# Patient Record
Sex: Male | Born: 2000 | Race: White | Hispanic: No | Marital: Single | Smoking: Never smoker
Health system: Southern US, Community
[De-identification: ages and names within clinical notes are randomized; demographics above are authoritative.]

## PROBLEM LIST (undated history)

## (undated) DIAGNOSIS — J45909 Unspecified asthma, uncomplicated: Secondary | ICD-10-CM

---
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
## 2021-08-16 ENCOUNTER — Telehealth (HOSPITAL_BASED_OUTPATIENT_CLINIC_OR_DEPARTMENT_OTHER): Admitting: Registered Nurse

## 2021-08-16 ENCOUNTER — Other Ambulatory Visit

## 2021-08-16 NOTE — Telephone Encounter (Signed)
Patient reached out after experiencing new onset panic attacks and depersonalization beginning in December. The panic attacks have gotten worse over the last month, and states he is having 3 panic attack a week. Last episode was last night. He states they do not have correlation with stressful events or situations, they just happen. He believes he has depression, but is undiagnosed. He is on no medication and has no medical history. No thoughts of self harm and he has never attempted to harm himself. He states he feels like he is "watching reality behind a screen" and "not in control of his body." [facility] who gave him the number to the [facility]. Upon further talking, he states that he isn't seeking to be placed on medication at this time, but wants to be seen by a provider to rule out any medical/internal causes to his new onset panic attacks.   I spoke with [place] about this patient. We do not currently have a psychiatrist to refer to, but have heard of a resource through [facility] called [facility] that has access to therapists and psychiatrists. I urged him to reach out to [facility] about this for at least diagnosing his depression and getting him connected with a psychiatrist. But he still wants to be seen in the [facility] for rule out of any internal causes.     Please book for general wellness check/panic attacks. Thanks.

## 2021-08-16 NOTE — Telephone Encounter (Signed)
[employer] student has a lot of anxiety, depersonalization and panic attacks.

## 2021-08-22 ENCOUNTER — Ambulatory Visit (HOSPITAL_BASED_OUTPATIENT_CLINIC_OR_DEPARTMENT_OTHER): Admitting: Registered Nurse

## 2021-08-22 ENCOUNTER — Encounter (HOSPITAL_BASED_OUTPATIENT_CLINIC_OR_DEPARTMENT_OTHER): Admitting: Registered Nurse

## 2021-08-22 ENCOUNTER — Ambulatory Visit: Attending: Registered Nurse | Admitting: Registered Nurse

## 2021-08-22 ENCOUNTER — Other Ambulatory Visit

## 2021-08-22 VITALS — BP 135/78 | HR 107 | Temp 98.7°F | Resp 20 | Ht 70.0 in | Wt 200.0 lb

## 2021-08-22 DIAGNOSIS — Z711 Person with feared health complaint in whom no diagnosis is made: Secondary | ICD-10-CM

## 2021-08-22 LAB — CBC WITH DIFFERENTIAL
Basophils %: 0.8 %
Basophils Absolute: 0.05 10*3/uL (ref 0.00–0.22)
Eosinophils %: 2.4 %
Eosinophils Absolute: 0.15 10*3/uL (ref 0.00–0.50)
Hematocrit: 47.3 % (ref 37.0–53.0)
Hemoglobin: 16.3 g/dL (ref 13.0–17.5)
Immature Granulocytes %: 0.3 %
Immature Granulocytes Absolute: 0.02 10*3/uL (ref 0.00–0.10)
Lymphocyte %: 35.5 %
Lymphocytes Absolute: 2.2 10*3/uL (ref 0.70–4.00)
MCH: 29.2 pg (ref 26.0–34.0)
MCHC: 34.5 g/dL (ref 31.0–37.0)
MCV: 84.8 fL (ref 80.0–100.0)
MPV: 10.9 fL (ref 9.1–12.4)
Monocytes %: 5.6 %
Monocytes Absolute: 0.35 10*3/uL — ABNORMAL LOW (ref 0.38–0.83)
NRBC %: 0 % (ref 0.0–0.0)
NRBC Absolute: 0 10*3/uL (ref 0.00–2.00)
Neutrophil %: 55.4 %
Neutrophils Absolute: 3.43 10*3/uL (ref 1.50–7.95)
Platelets: 265 10*3/uL (ref 150–400)
RBC: 5.58 M/uL (ref 4.20–5.90)
RDW-CV: 11.9 % (ref 11.5–14.5)
RDW-SD: 36.2 fL (ref 35.0–51.0)
WBC: 6.2 10*3/uL (ref 4.0–11.0)

## 2021-08-22 LAB — COMPREHENSIVE METABOLIC PANEL
ALT: 28 U/L (ref 0–55)
AST: 24 U/L (ref 6–42)
Albumin: 5.3 g/dL — ABNORMAL HIGH (ref 3.2–5.0)
Alkaline phosphatase: 67 U/L (ref 30–130)
Anion Gap: 10 mmol/L (ref 3–14)
BUN: 13 mg/dL (ref 6–24)
Bilirubin, total: 0.7 mg/dL (ref 0.2–1.2)
CO2 (Bicarbonate): 28 mmol/L (ref 20–32)
Calcium: 9.6 mg/dL (ref 8.5–10.5)
Chloride: 102 mmol/L (ref 98–110)
Creatinine: 0.92 mg/dL (ref 0.55–1.30)
Glucose: 84 mg/dL (ref 70–139)
Potassium: 3.8 mmol/L (ref 3.6–5.2)
Protein, total: 8 g/dL (ref 6.0–8.4)
Sodium: 140 mmol/L (ref 135–146)
eGFRcr: 121 mL/min/{1.73_m2} (ref >=60)

## 2021-08-22 LAB — IRON, TRANSFERRIN, TIBC
Iron Saturation: 34 % (ref 10–50)
Iron: 130 ug/dL (ref 30–180)
TIBC: 388 ug/dL (ref 253–463)
Transferrin: 277 mg/dL (ref 181–331)

## 2021-08-22 LAB — HEMOGLOBIN A1C: HEMOGLOBIN A1C % (INT/EXT): 5.1 % (ref <5.6)

## 2021-08-22 LAB — TSH WITH REFLEX: TSH: 1.39 u[IU]/mL (ref 0.27–4.94)

## 2021-08-22 LAB — MAGNESIUM: Magnesium: 2.1 mg/dL (ref 1.6–2.6)

## 2021-08-22 MED ORDER — propranolol (Inderal) 20 mg tablet
20 | ORAL_TABLET | ORAL | 5 refills | 30.00000 days | Status: AC
Start: 2021-08-22 — End: ?

## 2021-08-22 NOTE — Other (Signed)
Patient Education   Table of Contents       Cognitive Behavioral Therapy     To view videos and all your education online visit,   [email or web address]   or scan this QR code with your smartphone.                    Cognitive Behavioral Therapy     Cognitive behavioral therapy (CBT) is a short-term, goal-oriented type of talk therapy. CBT can help you:       Identify patterns of thinking, feeling, and behaving that are causing you problems.       Decide how you want to think, feel, and respond to life events.       Set goals to change the beliefs and thoughts that cause you to act in ways that are not helpful for you.       Follow up on the changes that you make.     What are the different types of CBT?       The different types of CBT include:      Dialectical behavioral therapy (DBT). This approach is often used in group therapy, and it helps you manage your behavior by focusing on:       Things that cause problems to start (triggers).       Methods of self-calming.       Re-evaluating thinking processes.       Mindfulness-based cognitive therapy. This approach involves focusing your attention, meditating, and developing awareness of the present moment (mindfulness).       Rational emotive behavior therapy. This approach uses rational thought to reframe your thinking so it is less judgmental. Your therapist may directly challenge your thought processes.       Stress inoculation training. This approach involves planning ahead for stressful situations by practicing new thoughts and behaviors. This planning can help you avoid going back to old actions.       Acceptance and commitment therapy (ACT). This approach focuses on accepting yourself as you are and practicing mindfulness. It helps you understand what you would like to change and how you can set goals in that direction.       What conditions is CBT used to treat?    CBT may help to treat:      Mental health conditions, including:       Depression.        Anxiety.       Bipolar disorder.       Eating disorders.       Post-traumatic stress disorder (PTSD).       Obsessive-compulsive disorder (OCD).       Insomnia and other sleep disorders.       Pain.       Stress.       Coping with loss or grief.       Coping with a difficult medical diagnosis or illness.       Relationship problems.       Emotional distress or shock (trauma).     How can CBT help me?       CBT may:       Give you a chance to share your thoughts, feelings, problems, and fears in a safe space.       Help you focus on specific problems.       Give you homework that helps you put theory into practice. Homework may include keeping a journal or doing thinking exercises.  Help you become aware of your patterns of thinking, feeling, and behaving, and how those three patterns affect each other.       Change your thoughts so that you can change your behaviors.       Help you choose how you want to view the world.       Teach you planned coping skills and offer better ways to deal with stress and difficult situations.      To make the most of CBT, make sure you:       Find a licensed therapist whom you trust.       Take an active part in your therapy and do the homework that you are given.       Are honest about your problems.       Avoid skipping your therapy sessions.       Summary         Cognitive behavioral therapy (CBT) is a short-term, goal-oriented type of talk therapy.       CBT can help you become aware of your patterns and the relationships among your thoughts, feelings, and behavior.       CBT may help mental health conditions and other problems.     This information is not intended to replace advice given to you by your health care provider. Make sure you discuss any questions you have with your health care provider.     Document Released: 05/01/2018Document Revised: 04/06/2022Document Reviewed: 09/26/2020     Elsevier Patient Education ? 2023 Elsevier Inc.

## 2021-08-22 NOTE — Result Quicknote (Signed)
Hello [name] [name],    So far all of your labs look like they are within normal limits. There are a few that won't come back until Monday so I will reach out to you then with the final results if there is anything to discuss.    I don't think it would hurt to try vitamin D supplementation (600-800 units per day) since it is very common for people to have borderline or low levels this time of year.    Also, if there is a delay in getting your psychiatry visit we could also consider starting you on an SSRI while you await a provider appointment. Let us know how we can assist.    Best regards,  Arminda Resides, NP

## 2021-08-22 NOTE — Progress Notes (Signed)
[facility], Division of Geographic Medicine and Infectious Diseases  [facility]  [address] [place]  [phone]    [name] [name] is here from [facility] for assessment of mental health concerns    Subjective   History of Present Illness:    [name] [name] is a 21 y.o. 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 for mental health concerns. One asthma flare over the break. Had a neb. Felet better. Does not use inhaler at baseline (has one for emergencies). Feels amount of stress in school is normal. Not the worst semester.     New onset panic attacks and dissociative episodes beginning in December 2022. He states he feels like he is "watching reality behind a screen" and "not in control of his body." The panic attacks have gotten worse over the last month up to 3 panic attack a week. Last episode was 2/28. No correlation with stressful events or situations. Episodes occur without any known trigger.    When he returned to [place] after break he really noticed things worsening. Depression has been more chronic, not biggest concern. Biggest concern is this panic and feelings of dissociation and depersonalization. Sometimes feels ok. Not constantly panicking or feeling deppressed. But more and more often having severe panic attacks. Heart beats faster. Breathing becomes labored and fast. Fidgety and restless. Feels like he is afraid, feels he is in danger. A few methods devised to help (sometimes works). Breathing exercise 4-7-8 for 4-8 reps seems to help stop things from progressing. GF has a lot of trauma and understands dissociation and she shared mindfulness body scan. Somewhat helpful. Most of the time not freaking out but feels unstable. Like it can happen at any time. Feels like cannot engage in his life. Cannot enjoy social gatherings for fear of having an  episode.    Some nights unable to sleep. Nightime particularly hard becuase there is nothing to distract him from his thoughts. WWhen on the edge of sleep gets violent jolt. Mostly ok. No dizziness per se. But when he starts to panic he feels a sense that his eyes are vibrating. Something behind eyes is very active and moving. Some intermittent tinitus when he gets anxious. Senses a white noise behind ears. Like his brain is making up some sound.    Talked to counselor a little bit about growing up in religous household (non denominational christian) and that has created a fear of hell and burning forever. Parents talked about hell growing up but not about him specifically. His parents were not thrilled about him not being religous but not angry. They said they still loved him and not disowning him. But an important part of being in his home community.     Last few weeks in constant state of existential thought. Reality seems unreal. Feels existential dread. Very visceral. Tapping foot nervously at baseline. Feels loved by parents. Does not like feeling this way.    Marijuana makes it worse. Edibles mostly. Smoking a little instead but worse. Increased panic and aggressive dissociation. Has stopped smoking or doing any MJ.    [facility] recommended he get checked for physical causes. His therapist was not more specific. [facility] out to [facility] to get appt with psychiatrist to get diagnosis. Does not want to be on Xanax. Denies HI/SI.    Feels acid reflux is mostly resolved. Suspect IBS? Really intense unbearable  abdominal pain feels like something lodged in lower esophagus above stomach. Cure was to make himself throw up. Last time made it worse. Has not has episode in a while but has Prilosec just in case.    Lives off campus in an apartment on [address]. 2 roommates. Friends. Get along well. With GF for 4 years. She lives in [place]. He intends to marry her.    Medications:  Current Outpatient  Medications on File Prior to Visit   Medication Sig Dispense Refill   . albuterol 90 mcg/actuation inhaler Inhale 2 puffs every 6 (six) hours if needed. Asthma       No current facility-administered medications on file prior to visit.      Allergies:  Allergies   Allergen Reactions   . Penicillins Unknown     Suspected d/t family history, has never taken      Past Medical History:    Past Medical History:   Diagnosis Date   . Asthma    . IBS (irritable bowel syndrome)      Past Surgical History:    History reviewed. No pertinent surgical history.    Family Medical History:    No family history on file.    Social History:    Social History     Tobacco Use   . Smoking status: Never   . Smokeless tobacco: Never   Substance Use Topics   . Alcohol use: Yes     Comment: 2x month, few drinks     Social History     Substance and Sexual Activity   Sexual Activity Not on file    Comment: Both each others first - she has an IUD - no need for STI testing - he plans to marry her      ROS   10 point ROS negative except where indicated in HPI.     Objective   Physical Examination:    Vitals:   Vitals:    08/22/21 1404   BP: 135/78   Pulse: 107   Resp: 20   Temp: 37.1 C (98.7 F)   SpO2: 98%       Physical Exam  Constitutional:       Appearance: Normal appearance.   HENT:      Mouth/Throat:      Mouth: Mucous membranes are moist.      Pharynx: No oropharyngeal exudate or posterior oropharyngeal erythema.   Cardiovascular:      Rate and Rhythm: Normal rate.   Pulmonary:      Effort: No respiratory distress.      Breath sounds: No wheezing.   Neurological:      Mental Status: He is alert.   Psychiatric:         Attention and Perception: Attention normal.         Mood and Affect: Mood normal.         Speech: Speech normal.         Behavior: Behavior normal. Behavior is cooperative.         Lab Results:  Recent Results (from the past 3696 hour(s))   Comprehensive metabolic panel    Collection Time: 08/22/21  3:00 PM   Result Value Ref  Range    Sodium 140 135 - 146 mmol/L    Potassium 3.8 3.6 - 5.2 mmol/L    Chloride 102 98 - 110 mmol/L    CO2 (Bicarbonate) 28 20 - 32 mmol/L    Anion Gap 10 3 -  14 mmol/L    BUN 13 6 - 24 mg/dL    Creatinine 1.61 0.96 - 1.30 mg/dL    eGFRcr 045 >=40 JW/JXB/1.47W*2    Glucose 84 70 - 139 mg/dL    Fasting? Unknown     Calcium 9.6 8.5 - 10.5 mg/dL    AST 24 6 - 42 U/L    ALT 28 0 - 55 U/L    Alkaline phosphatase 67 30 - 130 U/L    Protein, total 8.0 6.0 - 8.4 g/dL    Albumin 5.3 (H) 3.2 - 5.0 g/dL    Bilirubin, total 0.7 0.2 - 1.2 mg/dL   Hemoglobin N5A    Collection Time: 08/22/21  3:00 PM   Result Value Ref Range    Hemoglobin A1C 5.1 <5.6 %   CBC w/ Differential    Collection Time: 08/22/21  3:00 PM   Result Value Ref Range    WBC 6.2 4.0 - 11.0 K/uL    RBC 5.58 4.20 - 5.90 M/uL    Hemoglobin 16.3 13.0 - 17.5 g/dL    Hematocrit 21.3 08.6 - 53.0 %    MCV 84.8 80.0 - 100.0 fL    MCH 29.2 26.0 - 34.0 pg    MCHC 34.5 31.0 - 37.0 g/dL    RDW-CV 57.8 46.9 - 62.9 %    RDW-SD 36.2 35.0 - 51.0 fL    Platelets 265 150 - 400 K/uL    MPV 10.9 9.1 - 12.4 fL    Neutrophil % 55.4 %    Lymphocyte % 35.5 %    Monocytes % 5.6 %    Eosinophils % 2.4 %    Basophils % 0.8 %    Immature Granulocytes % 0.3 %    NRBC % 0.0 0.0 - 0.0 %    Neutrophils Absolute 3.43 1.50 - 7.95 K/uL    Lymphocytes Absolute 2.20 0.70 - 4.00 K/uL    Monocytes Absolute 0.35 (L) 0.38 - 0.83 K/uL    Eosinophils Absolute 0.15 0.00 - 0.50 K/uL    Basophils Absolute 0.05 0.00 - 0.22 K/uL    Immature Granulocytes Absolute 0.02 0.00 - 0.10 K/uL    NRBC Absolute 0.00 0.00 - 2.00 K/uL   Iron + transferrin + TIBC    Collection Time: 08/22/21  3:00 PM   Result Value Ref Range    Iron 130 30 - 180 ug/dL    TIBC 528 413 - 244 ug/dL    Transferrin 010 272 - 331 mg/dL    Iron Saturation 34 10 - 50 %   Magnesium    Collection Time: 08/22/21  3:00 PM   Result Value Ref Range    Magnesium 2.1 1.6 - 2.6 mg/dL   TSH with reflex    Collection Time: 08/22/21  3:00 PM   Result Value  Ref Range    TSH 1.39 0.27 - 4.94 uIU/mL       Assessment and Plan:  Diagnoses and all orders for this visit:  Mental health-related complaint  -     CBC and differential  -     Comprehensive metabolic panel  -     Vit D 25 hydroxy  -     Iron + transferrin + TIBC; Future  -     Hemoglobin A1c  -     Vitamin B6; Future  -     Magnesium; Future  -     propranolol (Inderal) 20 mg tablet; Pt may take 1 tablet (20g) every  6 hours as needed for panic and anxiety  Physical exam  -     CBC and differential  -     Comprehensive metabolic panel  -     Vit D 25 hydroxy  -     Iron + transferrin + TIBC; Future  -     Hemoglobin A1c  -     Vitamin B6; Future  -     Magnesium; Future  Persistent depressive disorder  -     CBC and differential  -     Comprehensive metabolic panel  -     Vit D 25 hydroxy  -     Iron + transferrin + TIBC; Future  -     Hemoglobin A1c  -     Vitamin B6; Future  -     Magnesium; Future  Panic attack  -     CBC and differential  -     Comprehensive metabolic panel  -     Vit D 25 hydroxy  -     Iron + transferrin + TIBC; Future  -     Hemoglobin A1c  -     Vitamin B6; Future  -     Magnesium; Future  -     propranolol (Inderal) 20 mg tablet; Pt may take 1 tablet (20g) every 6 hours as needed for panic and anxiety  Other fatigue  -     TSH with reflex; Future     Mental health concerns. Pt in touch with [facility]. Seeing counselor and will see psychiatrist soon via [facility]. His therapist suggested he come in to the [facility] to be examined to determine if there was a physiological reason for his symptoms. She did not elaborate further about if she intended him to be seen by neurology or primary care. TSH, Vit D, Vit B6, A1C, CMP, CBC, Magnesium all WNL. Discussed propranolol with patient and prescribed 20mg  PRN for panic attacks. Provided pt with education regarding propranolol and other techniques for defusing panic attacks and managing anxiety. Encouraged pt to contact the  [facility] if unable to connect with psychiatry or if interested in starting SSRI in interim. Pt expressed understanding.    I personally spent a total 45 minutes in record review, interview/examination of the patient, coordination of care, communications, documentation, counseling and discussion with the patient as described above.  All patient's questions were answered.

## 2021-08-22 NOTE — Result Quicknote (Signed)
Hello [name] [name],    Your vitamin D level is slightly low at 17 (we would like you to be at 30 or above).  I would recommend supplementing Vitamin D3, one dose per day of 800 to 1000 international units (20 to 25 micrograms). You should be able to find this in the pharmacy over the counter.    We should plan to have you back in 3 months to recheck (will you be here in June?).    How are you doing in your efforts to connect with Psychiatry through the [facility] application?    Please let us know how we can help.    Best regards,  Arminda Resides, NP

## 2021-08-22 NOTE — Other (Signed)
Patient Education   Table of Contents       Managing Anxiety, Adult       Mindfulness-Based Stress Reduction       Panic Attack     To view videos and all your education online visit,   [email or web address]   or scan this QR code with your smartphone.                    Managing Anxiety, Adult     After being diagnosed with anxiety, you may be relieved to know why you have felt or behaved a certain way. You may also feel overwhelmed about the treatment ahead and what it will mean for your life. With care and support, you can manage this condition.   How to manage lifestyle changes   Managing stress and anxiety      Stress is your body's reaction to life changes and events, both good and bad. Most stress will last just a few hours, but stress can be ongoing and can lead to more than just stress. Although stress can play a major role in anxiety, it is not the same as anxiety. Stress is usually caused by something external, such as a deadline, test, or competition. Stress normally passes after the triggering event has ended.   Anxiety is caused by something internal, such as imagining a terrible outcome or worrying that something will go wrong that will devastate you. Anxiety often does not go away even after the triggering event is over, and it can become long-term (chronic) worry. It is important to understand the differences between stress and anxiety and to manage your stress effectively so that it does not lead to an anxious response.    Talk with your health care provider or a counselor to learn more about reducing anxiety and stress. He or she may suggest tension reduction techniques, such as:       Music therapy. Spend time creating or listening to music that you enjoy and that inspires you.       Mindfulness-based meditation. Practice being aware of your normal breaths while not trying to control your breathing. It can be done while sitting or walking.       Centering prayer. This involves focusing  on a word, phrase, or sacred image that means something to you and brings you peace.       Deep breathing. To do this, expand your stomach and inhale slowly through your nose. Hold your breath for 3?5 seconds. Then exhale slowly, letting your stomach muscles relax.       Self-talk. Learn to notice and identify thought patterns that lead to anxiety reactions and change those patterns to thoughts that feel peaceful.       Muscle relaxation. Taking time to tense muscles and then relax them.     Choose a tension reduction technique that fits your lifestyle and personality. These techniques take time and practice. Set aside 5?15 minutes a day to do them. Therapists can offer counseling and training in these techniques. The training to help with anxiety may be covered by some insurance plans.    Other things you can do to manage stress and anxiety include:       Keeping a stress diary. This can help you learn what triggers your reaction and then learn ways to manage your response.       Thinking about how you react to certain situations. You may not be able to control everything, but  you can control your response.       Making time for activities that help you relax and not feeling guilty about spending your time in this way.       Doing visual imagery. This involves imagining or creating mental pictures to help you relax.       Practicing yoga. Through yoga poses, you can lower tension and promote relaxation.     Medicines    Medicines can help ease symptoms. Medicines for anxiety include:       Antidepressant medicines. These are usually prescribed for long-term daily control.       Anti-anxiety medicines. These may be added in severe cases, especially when panic attacks occur.     Medicines will be prescribed by a health care provider. When used together, medicines, psychotherapy, and tension reduction techniques may be the most effective treatment.   Relationships    Relationships can play a big part in helping you  recover. Try to spend more time connecting with trusted friends and family members.       Consider going to couples counseling if you have a partner, taking family education classes, or going to family therapy.       Therapy can help you and others better understand your condition.       How to recognize changes in your anxiety    Everyone responds differently to treatment for anxiety. Recovery from anxiety happens when symptoms decrease and stop interfering with your daily activities at home or work. This may mean that you will start to:       Have better concentration and focus. Worry will interfere less in your daily thinking.       Sleep better.       Be less irritable.       Have more energy.       Have improved memory.     It is also important to recognize when your condition is getting worse. Contact your health care provider if your symptoms interfere with home or work and you feel like your condition is not improving.   Follow these instructions at home:   Activity        Exercise. Adults should do the following:       Exercise for at least 150 minutes each week. The exercise should increase your heart rate and make you sweat (moderate-intensity exercise).       Strengthening exercises at least twice a week.       Get the right amount and quality of sleep. Most adults need 7?9 hours of sleep each night.     Lifestyle           Eat a healthy diet that includes plenty of vegetables, fruits, whole grains, low-fat dairy products, and lean protein.      Do not  eat a lot of foods that are high in fats, added sugars, or salt (sodium).       Make choices that simplify your life.      Do not  use any products that contain nicotine or tobacco. These products include cigarettes, chewing tobacco, and vaping devices, such as e-cigarettes. If you need help quitting, ask your health care provider.       Avoid caffeine, alcohol, and certain over-the-counter cold medicines. These may make you feel worse. Ask your  pharmacist which medicines to avoid.     General instructions         Take over-the-counter and prescription medicines only  as told by your health care provider.       Keep all follow-up visits. This is important.       Where to find support    You can get help and support from these sources:       Self-help groups.       Online and [employer] organizations.       A trusted spiritual leader.       Couples counseling.       Family education classes.       Family therapy.     Where to find more information    You may find that joining a support group helps you deal with your anxiety. The following sources can help you locate counselors or support groups near you:       [employer] [place]: www.mentalhealthamerica.net         [employer]xiety [employer] [place] (ADAA): ProgramCam.dewww.adaa.org         [employer] Mental [employer]ness (NAMI): www.nami.org       Contact a health care provider if:         You have a hard time staying focused or finishing daily tasks.       You spend many hours a day feeling worried about everyday life.       You become exhausted by worry.       You start to have headaches or frequently feel tense.       You develop chronic nausea or diarrhea.     Get help right away if:         You have a racing heart and shortness of breath.       You have thoughts of hurting yourself or others.     If you ever feel like you may hurt yourself or others, or have thoughts about taking your own life, get help right away. Go to your nearest emergency department or:      Call your local emergency services (911 in the [place]      Call a suicide crisis helpline, such as the National Suicide Prevention Lifeline at 671-660-33721-(260) 005-0213 or 988 in the [place] This is open 24 hours a day in the [place]      Text the Crisis Text Line at (831) 220-1470741741 (in the [place]     Summary         Taking steps to learn and use tension reduction techniques can help calm you and help prevent triggering an anxiety reaction.       When used together,  medicines, psychotherapy, and tension reduction techniques may be the most effective treatment.       Family, friends, and partners can play a big part in supporting you.     This information is not intended to replace advice given to you by your health care provider. Make sure you discuss any questions you have with your health care provider.     Document Released: 12/12/2017Document Revised: 07/13/2022Document Reviewed: 09/30/2020     Elsevier Patient Education ? 2023 Elsevier Inc.         Mindfulness-Based Stress Reduction     Mindfulness-based stress reduction (MBSR) is a program that helps people learn to practice mindfulness. Mindfulness is the practice of consciously paying attention to the present moment. MBSR focuses on developing self-awareness, which lets you respond to life stress without judgment or negative feelings. It can be learned and practiced through techniques such as education, breathing exercises, meditation, and yoga.  MBSR includes several mindfulness techniques in one program.    MBSR works best when you understand the treatment, are willing to try new things, and can commit to spending time practicing what you learn. MBSR training may include learning about:       How your feelings, thoughts, and reactions affect your body.       New ways to respond to things that cause negative thoughts to start (triggers).       How to notice your thoughts and let go of them.       Practicing awareness of everyday things that you normally do without thinking.       The techniques and goals of different types of meditation.     What are the benefits of MBSR?    MBSR can have many benefits, which include helping you to:       Develop self-awareness. This means knowing and understanding yourself.       Learn skills and attitudes that help you to take part in your own health care.       Learn new ways to care for yourself.       Be more accepting about how things are, and let things go.       Be less  judgmental and approach things with an open mind.       Be patient with yourself and trust yourself more.      MBSR has also been shown to:       Reduce negative emotions, such as sadness, overwhelm, and worry.       Improve memory and focus.       Change how you sense and react to pain.       Boost your body's ability to fight infections.       Help you connect better with other people.       Improve your sense of well-being.     How to practice mindfulness   To do a basic awareness exercise:         Find a comfortable place to sit.       Pay attention to the present moment. Notice your thoughts, feelings, and surroundings just as they are.       Avoid judging yourself, your feelings, or your surroundings. Make note of any judgment that comes up and let it go.       Your mind may wander, and that is okay. Make note of when your thoughts drift, and return your attention to the present moment.     To do basic mindfulness meditation:        Find a comfortable place to sit. This may include a stable chair or a firm floor cushion.       Sit upright with your back straight. Let your arms fall next to your sides, with your hands resting on your legs.       If you are sitting in a chair, rest your feet flat on the floor.       If you are sitting on a cushion, cross your legs in front of you.       Keep your head in a neutral position with your chin dropped slightly. Relax your jaw and rest the tip of your tongue on the roof of your mouth. Drop your gaze to the floor or close your eyes.       Breathe normally and pay attention to your breath. Feel the air moving in and  out of your nose. Feel your belly expanding and relaxing with each breath.       Your mind may wander, and that is okay. Make note of when your thoughts drift, and return your attention to your breath.       Avoid judging yourself, your feelings, or your surroundings. Make note of any judgment or feelings that come up, let them go, and bring your attention  back to your breath.       When you are ready, lift your gaze or open your eyes. Pay attention to how your body feels after the meditation.     Follow these instructions at home:            Find a local in-person or online MBSR program.       Set aside some time regularly for mindfulness practice. Practice every day if you can. Even 10 minutes of practice is helpful.      Find a mindfulness practice that works best for you. This may include one or more of the following:       Meditation. This involves focusing your mind on a certain thought or activity.       Breathing awareness exercises. These help you to stay present by focusing on your breath.       Body scan. For this practice, you lie down and pay attention to each part of your body from head to toe. You can identify tension and soreness and consciously relax parts of your body.       Yoga. Yoga involves stretching and breathing, and it can improve your ability to move and be flexible. It can also help you to test your body's limits, which can help you release stress.       Mindful eating. This way of eating involves focusing on the taste, texture, color, and smell of each bite of food. This slows down eating and helps you feel full sooner. For this reason, it can be an important part of a weight loss plan.       Find a podcast or recording that provides guidance for breathing awareness, body scan, or meditation exercises. You can listen to these any time when you have a free moment to rest without distractions.       Follow your treatment plan as told by your health care provider. This may include taking regular medicines and making changes to your diet or lifestyle as recommended.       Where to find more information    You can find more information about MBSR from:       Your health care provider.       Community-based meditation centers or programs.       Programs offered near you.     Summary         Mindfulness-based stress reduction (MBSR) is a program  that teaches you how to consciously pay attention to the present moment. It is used to help you deal better with daily stress, feelings, and pain.       MBSR focuses on developing self-awareness, which allows you to respond to life stress without judgment or negative feelings.       MBSR programs may involve learning different mindfulness practices, such as breathing exercises, meditation, yoga, body scan, or mindful eating. Find a mindfulness practice that works best for you, and set aside time for it on a regular basis.     This information is not intended to replace  advice given to you by your health care provider. Make sure you discuss any questions you have with your health care provider.     Document Released: 04/26/2018Document Revised: 07/28/2022Document Reviewed: 01/17/2021     Elsevier Patient Education ? 2023 Elsevier Inc.         Panic Attack     A panic attack is a sudden episode of severe anxiety, fear, or discomfort that causes physical and emotional symptoms. A panic attack may be in response to something frightening, or it may occur for no known reason.   Symptoms of a panic attack can be similar to symptoms of a heart attack or stroke. It is important to see your health care provider when you have a panic attack so that these conditions can be ruled out.   What are the causes?    A panic attack may be caused by:       An extreme, life-threatening situation, such as a war or natural disaster.       An anxiety disorder, such as post-traumatic stress disorder.       Depression.       Panic disorder.       Certain medical conditions, including heart problems, neurological conditions, and infections.      Other causes may include:       Certain over-the-counter and prescription medicines.       Supplements that increase anxiety.       Illegal drugs that increase heart rate and blood pressure, such as methamphetamine.     What increases the risk?    You are more likely to develop this condition if:        You have another mental health condition.       You use alcohol, illegal drugs, or other substances.       You are under extreme stress.       A life event is causing increased feelings of anxiety and depression.     What are the signs or symptoms?    A panic attack starts suddenly, usually lasts 5?10 minutes, and occurs with one or more of the following:       A pounding heart, or a feeling that your heart is beating irregularly or faster than normal (palpitations).       Sweating, trembling, or shaking.       Shortness of breath, feeling smothered, or feeling choked.       Chest pain or discomfort.       Nausea or a strange feeling in your stomach.       Dizziness, feeling light-headed, or feeling like you might faint.      Other symptoms may include:       Chills or hot flashes.       Numbness or tingling in your lips, hands, or feet.       Feeling confused, or feeling that you are not yourself.       Fear of losing control or of being emotionally unstable, or fear of dying.     How is this diagnosed?       A panic attack is diagnosed with an assessment by your health care provider. During the assessment, your health care provider will ask questions about:       Your history of anxiety, depression, and panic attacks.       Your medical history.       Whether you drink alcohol, use drugs, take supplements, or take medicines.  Be honest about your substance use.      Your health care provider may also:       Order blood tests or other kinds of tests to rule out serious medical conditions.       Refer you to a mental health professional for further evaluation.       How is this treated?    A panic attack is a symptom of another condition. Treatment depends on the cause of the panic attack.       If the cause is a medical problem, your health care provider will treat that problem or refer you to a specialist.       If the cause is emotional, you may be given anti-anxiety medicines or referred to a counselor.  Anti-anxiety medicines may reduce how often attacks happen, reduce how severe the attacks are, and lower anxiety.       If the cause is a medicine, your health care provider may tell you to stop the medicine, change your dose, or take a different medicine.       If the cause is an illegal drug, treatment may involve letting the drug wear off and taking medicine to help the drug leave your body or to stop its effects. Attacks caused by heavy drug use may continue even if you stop using the drug.     Most panic attacks go away with treatment of the underlying problem. If you have panic attacks often, you may have a condition called panic disorder.   Follow these instructions at home:   Alcohol use        Do not  drink alcohol if:       Your health care provider tells you not to drink.       You are pregnant, may be pregnant, or are planning to become pregnant.      If you drink alcohol:      Limit how much you have to:       0?1 drink a day for women.       0?2 drinks a day for men.       Know how much alcohol is in your drink. In the [place] one drink equals one 12 oz bottle of beer (355 mL), one 5 oz glass of wine (148 mL), or one 1? oz glass of hard liquor (44 mL).     General instructions         Take over-the-counter and prescription medicines only as told by your health care provider.       If you feel anxious, limit your caffeine intake.      Take good care of your physical and mental health by:       Eating a balanced diet that includes plenty of fresh fruits and vegetables, whole grains, lean meats, and low-fat dairy.       Getting plenty of rest. Try to get 7?8 hours of uninterrupted sleep each night.       Exercising regularly. Try to get 30 minutes of physical activity at least 5 days a week.      Do not  use any products that contain nicotine or tobacco. These products include cigarettes, chewing tobacco, and vaping devices, such as e-cigarettes. If you need help quitting, ask your health care provider.        Keep all follow-up visits. This is important. Panic attacks may have underlying physical or emotional problems that take time to accurately diagnose.  Where to find more information         Substance Abuse and Mental [facility]): RockToxic.pl         [employer] [facility]): http://www.maynard.net/       Contact a health care provider if:         Your symptoms do not improve, or they get worse.       You are not able to take your medicine as prescribed because of side effects.     Get help right away if:         You have thoughts about hurting yourself or others.     Get help right away if you feel like you may hurt yourself or others, or have thoughts about taking your own life. Go to your nearest emergency room or:      Call 911.      Call the National Suicide Prevention Lifeline at 541 198 1209 or 988. This is open 24 hours a day.      Text the Crisis Text Line at 4328144475.     Summary         A panic attack is a sudden episode of severe anxiety, fear, or discomfort that causes physical and emotional symptoms.       Always see a health care provider to have the reasons for the panic attack correctly diagnosed.       If your panic attack was caused by a physical problem, follow your health care provider's suggestions for medicine, referral to a specialist, and lifestyle changes.       If your panic attack was caused by an emotional problem, follow through with counseling from a qualified mental health specialist.       If you feel like you may hurt yourself or others, call 911 and get help right away.     This information is not intended to replace advice given to you by your health care provider. Make sure you discuss any questions you have with your health care provider.     Document Released: 12/18/2006Document Revised: 07/28/2022Document Reviewed: 01/17/2021     Elsevier Patient Education ? 2023 Elsevier Inc.

## 2021-08-22 NOTE — Patient Instructions (Addendum)
Please continue to follow up with [facility] for a psychiatric evaluation.    Investigate resources for possible Cognitive Behavioral Therapy or other mixed pharmaceutical and talk based interventions.    Continue using breathing exercises to refocus energy on the breath.    Please review the attached educational information.    Present for medical attention if difficulty breathing, chest pain, if unable to eat or drink or feel too weak to get out of bed or complete activities of daily life.   Contact the [facility] through the portal or at [phone]. We are open M-F, 9am - 5pm.

## 2021-08-26 LAB — VITAMIN D 25 HYDROXY
Vit D, 25-Hydroxy: 17 ng/mL — ABNORMAL LOW (ref 20–100)
Vitamin D 25-OH, D2: <2 ng/mL
Vitamin D 25-OH, D3: 17 ng/mL

## 2021-08-27 LAB — VITAMIN B6, PLASMA: Vitamin B6, Plasma: 37 ng/mL — ABNORMAL HIGH (ref 2.1–21.7)

## 2021-09-12 ENCOUNTER — Encounter (HOSPITAL_BASED_OUTPATIENT_CLINIC_OR_DEPARTMENT_OTHER): Admitting: Registered Nurse

## 2021-09-12 ENCOUNTER — Encounter

## 2021-09-12 ENCOUNTER — Telehealth (HOSPITAL_BASED_OUTPATIENT_CLINIC_OR_DEPARTMENT_OTHER): Admitting: Registered Nurse

## 2021-09-12 DIAGNOSIS — F489 Nonpsychotic mental disorder, unspecified: Secondary | ICD-10-CM

## 2021-09-12 NOTE — Progress Notes (Signed)
[facility], Division of Geographic Medicine and Infectious Diseases  [facility]  [address] [place]  [phone]    [name] [name] is here from [facility] for follow up on Mental Health issues and connect to care.    Subjective   History of Present Illness:    [name] [name] is a 21 y.o. male with PMH of presents to the [facility] for follow up on Mental Health issues and connect to care.    Met with [facility]. Tomorrow will attend another Zoom meeting where he might met the owner of the practice along with the prescriber to explore options for treatment. Feels encouraged by this. No pressure to go on medications. Feels they are supportive and understand his needs.     No intense episodes of anxiety or depersonalization in past few weeks. No need for propanolol (has not taken yet).    Taking Vitamin D every day (in response to low levels discovered at visit). Feels slightly better after taking the Vitamin D. GF has been in town for a week and is staying for a few more weeks. Feels very supported. He has felt more engaged in activities without anxiety. Support system with GF. GF has a long history with anxiety and can empathize. GF takes Prozac and has ADHD and takes Adderal. From [place] but she lives in [place] with grandparents.    No stomach issues for a few months.    Plans to go to [place] for summer.    Still dealing with intermittent bouts depersonalization and strange sensation of detachment. Feels like getting better at understanding when it's happening. Using cognitive diffusion techniques that instruct him that you don't have to buy every thought that goes through your head. When these thoughts come on he can say whether it is true or not. Happens every few days. Not as intense as they used to be. Diffusing actively with this technique with success.     Also still gets "anxious brain" which is a kind of fear and happens a  few times per week. His response is to freeze. Recently was walking late at night and felt unsafe in the neighborhood and was being harassed and felt himself wanting to freeze and stand still. GF was dragging him away. Spent time together trying to process and felt better. Other times there is no specific event to trigger the fear. Sometimes with thoughts about death or about random thoughts. Perhaps the thought says "you're anxious now" and then just spirals out of control. Life doesn't feel like it used to. How he perceives the world is different than how you used. He dislikes it and hopes he can get back to how it was.    Feels about baseline for energy and mood. Walking every day. Does not feel he can't leave the house.    This real-time, interactive virtual Telehealth encounter was done by video with the patient's verbal consent. Two patient identifiers were used and confirmed. Physical location of the patient: home Patient resides in: [place]  Physical location of the provider: office. Other participants/involvement: none  Total minutes spent: 30    Medications:  Current Outpatient Medications on File Prior to Visit   Medication Sig Dispense Refill   . albuterol 90 mcg/actuation inhaler Inhale 2 puffs every 6 (six) hours if needed. Asthma     . propranolol (Inderal) 20 mg tablet Pt may take 1 tablet (20g) every 6 hours as needed for panic  and anxiety 30 tablet 5     No current facility-administered medications on file prior to visit.        Allergies:  Allergies   Allergen Reactions   . Penicillins Unknown     Suspected d/t family history, has never taken      Past Medical History:    Past Medical History:   Diagnosis Date   . Asthma    . IBS (irritable bowel syndrome)      Past Surgical History:    No past surgical history on file.    Family Medical History:    No family history on file.    Social History:    Social History     Tobacco Use   . Smoking status: Never   . Smokeless tobacco: Never   Vaping Use   .  Vaping status: Not on file   Substance Use Topics   . Alcohol use: Yes     Comment: 2x month, few drinks     Social History     Substance and Sexual Activity   Sexual Activity Not on file    Comment: Both each others first - she has an IUD - no need for STI testing - he plans to marry her      ROS   10 point ROS negative except where indicated in HPI.     Objective   Physical Examination:    Vitals: There were no vitals filed for this visit.    Physical Exam  Not performed per video call    Lab Results:  No results found for this or any previous visit (from the past 168 hour(s)).    Assessment and Plan:  Diagnoses and all orders for this visit:  Mental health problem     Follow up on mental health and connect to care. [facility] to [facility] for issues with anxiety, depersonalization and depression. At that time pt wished to rule out physiological cause. All tests WNL except for Vitamin D level found to be low and Vit B6 elevated (unclear on cause). Pt without any neuropathy or other clear signs of toxicity.  Pt started Vit D supplementation and states not taking any other supplements/ vitamins. Will retest next month for follow up. Pt states feeling improved with Vit D supplementation. Pt reports met with [facility] once yesterday and tomorrow will attend another Zoom meeting where he might met the owner of the practice along with the prescriber to explore options for treatment. Feels encouraged by this. No pressure to go on medications. Feels they are supportive and understand his needs. Still experiencing mental health symptoms, but lessened in intensity. GF is visiting and providing support which is helpful. Pt will contact us with any needs acute going forward.    I personally spent a total 30 minutes in record review, interview/examination of the patient, coordination of care, communications, documentation, counseling and discussion with the patient as described above.  All patient's  questions were answered.
